# Patient Record
Sex: Male | Born: 1975 | Race: White | Hispanic: Yes | Marital: Single | Smoking: Never smoker
Health system: Southern US, Community
[De-identification: ages and names within clinical notes are randomized; demographics above are authoritative.]

## PROBLEM LIST (undated history)

## (undated) DIAGNOSIS — I10 Essential (primary) hypertension: Secondary | ICD-10-CM

## (undated) DIAGNOSIS — K219 Gastro-esophageal reflux disease without esophagitis: Secondary | ICD-10-CM

## (undated) HISTORY — DX: Gastro-esophageal reflux disease without esophagitis: K21.9

## (undated) HISTORY — PX: APPENDECTOMY: SHX54

## (undated) HISTORY — PX: CHOLECYSTECTOMY: SHX55

---

## 2003-12-21 ENCOUNTER — Inpatient Hospital Stay (HOSPITAL_COMMUNITY): Admission: EM | Admit: 2003-12-21 | Discharge: 2003-12-23 | Payer: Self-pay | Admitting: Emergency Medicine

## 2003-12-21 ENCOUNTER — Encounter (INDEPENDENT_AMBULATORY_CARE_PROVIDER_SITE_OTHER): Payer: Self-pay | Admitting: *Deleted

## 2004-01-12 ENCOUNTER — Ambulatory Visit (HOSPITAL_COMMUNITY): Admission: RE | Admit: 2004-01-12 | Discharge: 2004-01-12 | Payer: Self-pay | Admitting: Surgery

## 2005-01-11 IMAGING — CT CT ABDOMEN WO/W CM
1 of 2 series · 14 of 32 positions shown, 20 images · IV contrast ([ID] OMNI 300)
Comparison: none

CLINICAL DATA: Cholecystectomy three weeks ago.  Right-sided pain, hematuria.  Rule out kidney stone.  History of appendectomy. 
 CT ABDOMEN WITHOUT AND WITH CONTRAST 01/12/04
 Contrast:  120 cc Omnipaque 300 IV. 
 Initial unenhanced scans were obtained to evaluate for renal calculi.  There is a 4 mm stone in the left mid kidney.  However, the patient has symptoms on the right at this time.  No kidney stones are seen on the right.  There is no hydronephrosis.  The gallbladder has been removed.  After contrast infusion, the liver, spleen, and pancreas are normal.  The kidneys enhance normally and without any obstruction.  The bowel appears normal. 
 IMPRESSION
 4 mm left renal calculus without obstruction.
 CT PELVIS WITHOUT AND WITH CONTRAST
 No ureteral calculi are identified.  There is no free fluid, and the bowel appears normal.  There is no adenopathy. 
 Negative.

[Series 3: routine abdomen · axial · 0.70mm/px · z∈[-420,-25]mm · 14 of 117 slices shown, 20 images]
[im 7/117  soft-tissue]
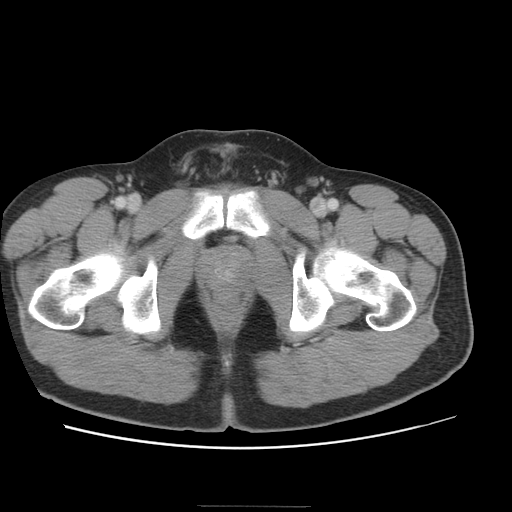
[im 7/117  bone]
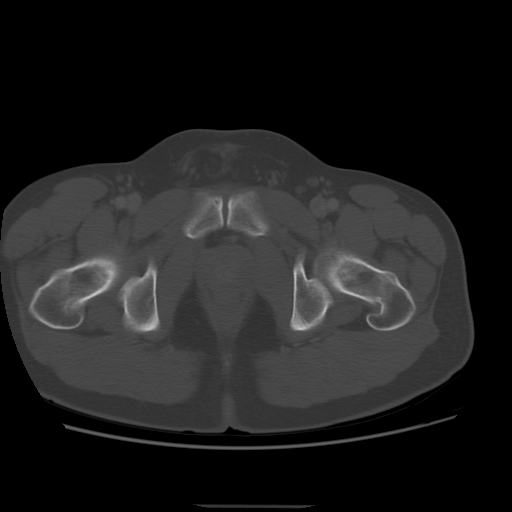
[im 14/117  soft-tissue]
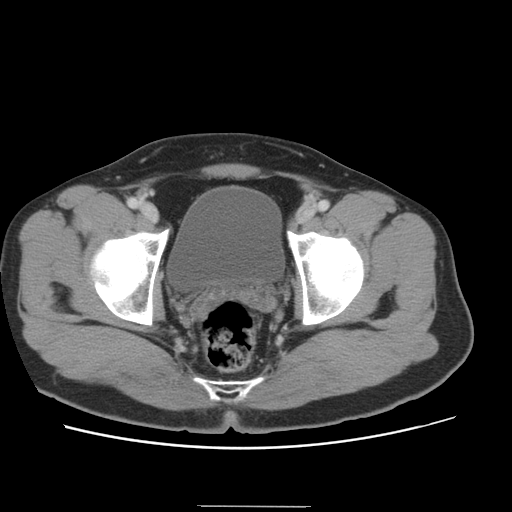
[im 21/117  soft-tissue]
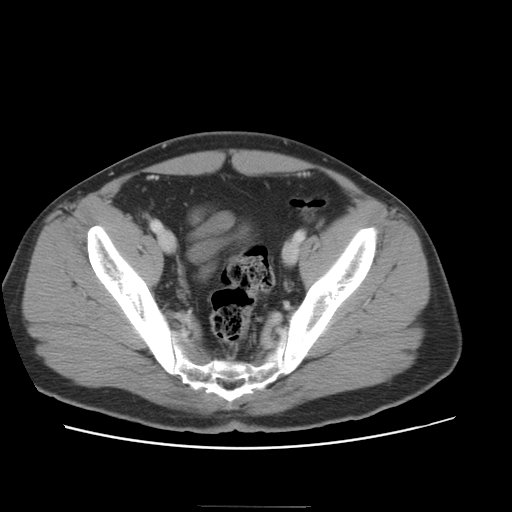
[im 35/117  soft-tissue]
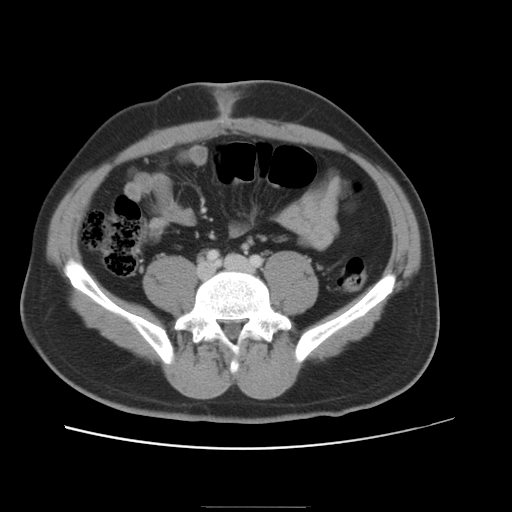
[im 41/117  soft-tissue]
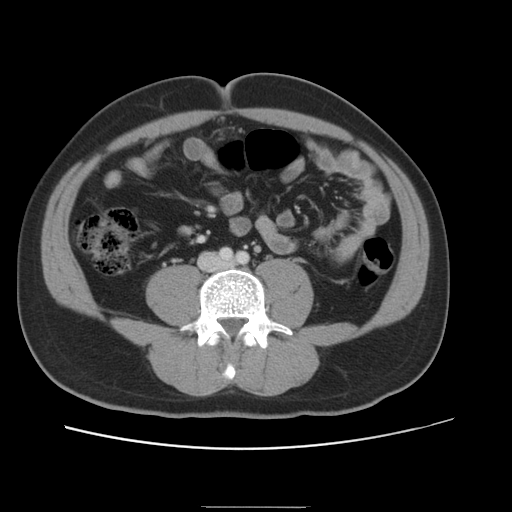
[im 48/117  soft-tissue]
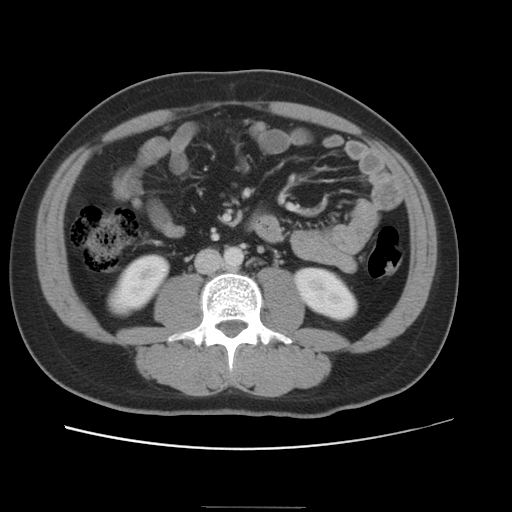
[im 55/117  soft-tissue]
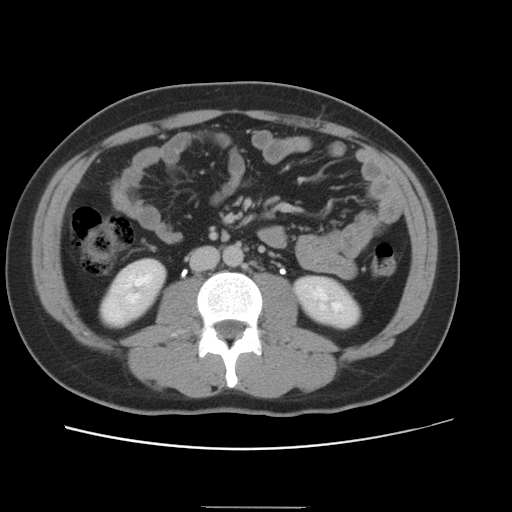
[im 62/117  soft-tissue]
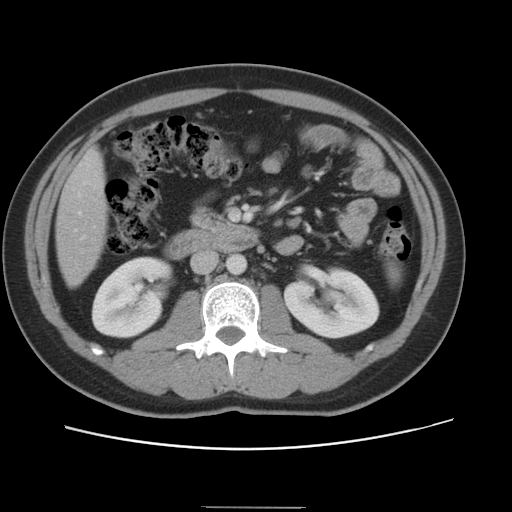
[im 69/117  soft-tissue]
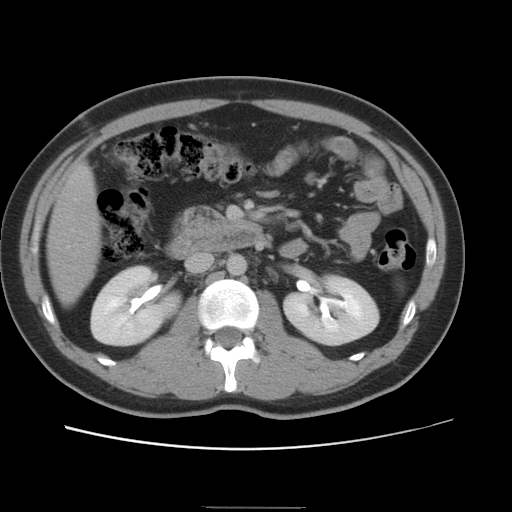
[im 69/117  bone]
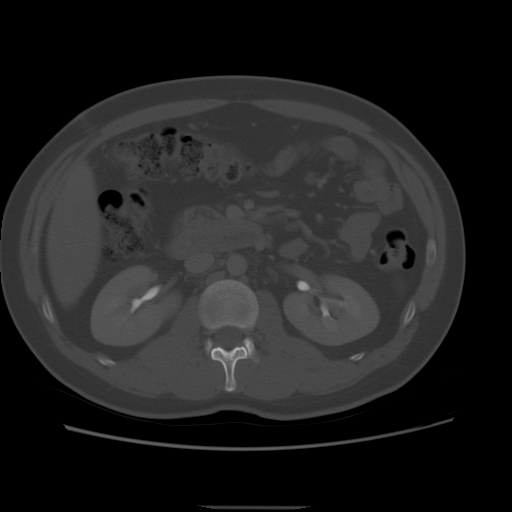
[im 76/117  soft-tissue]
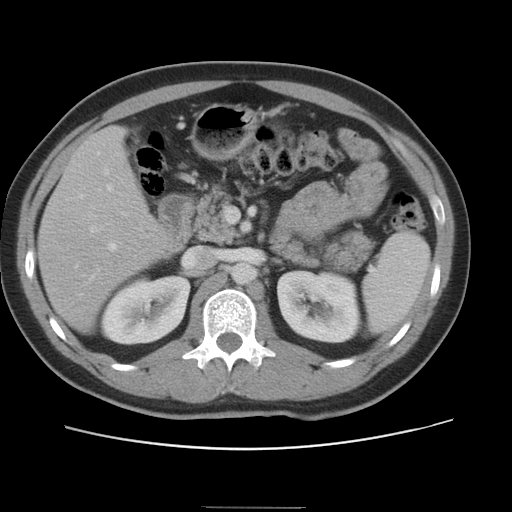
[im 89/117  soft-tissue]
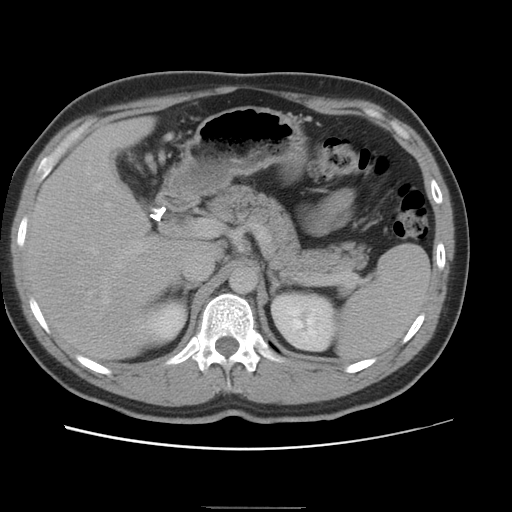
[im 89/117  lung]
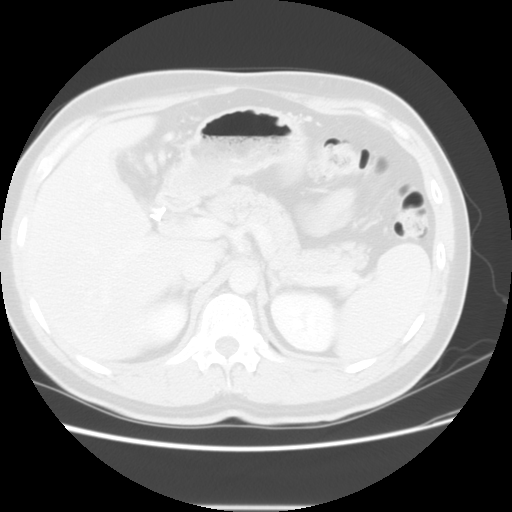
[im 96/117  soft-tissue]
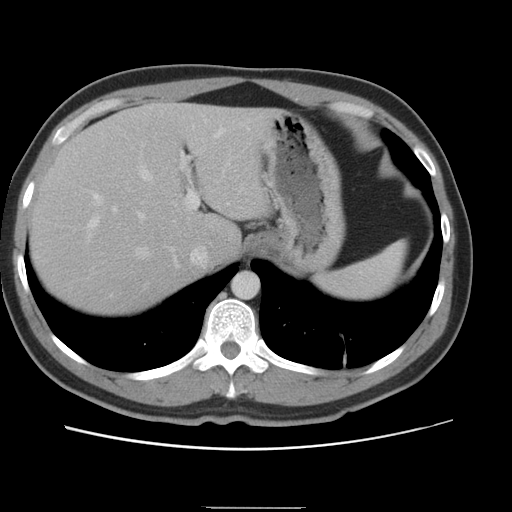
[im 96/117  lung]
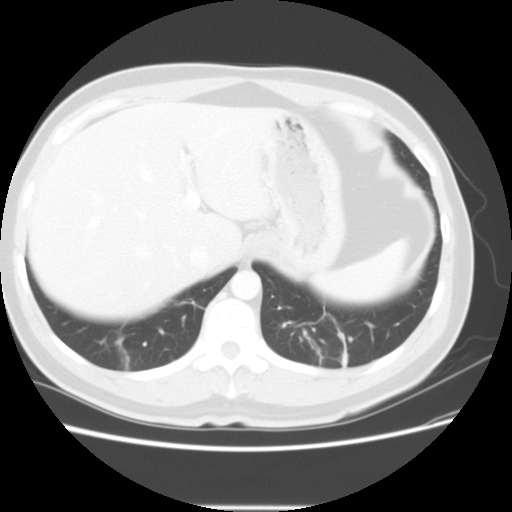
[im 103/117  soft-tissue]
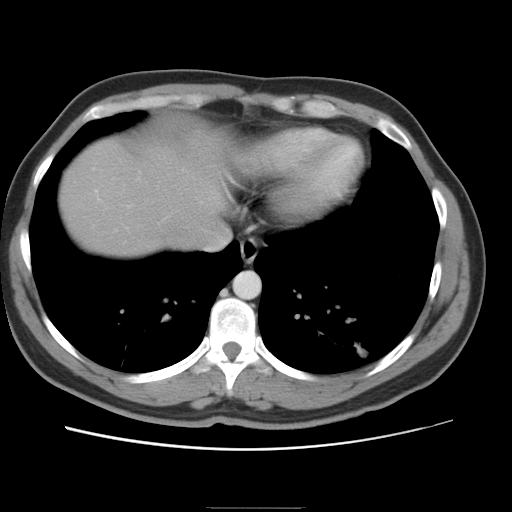
[im 103/117  lung]
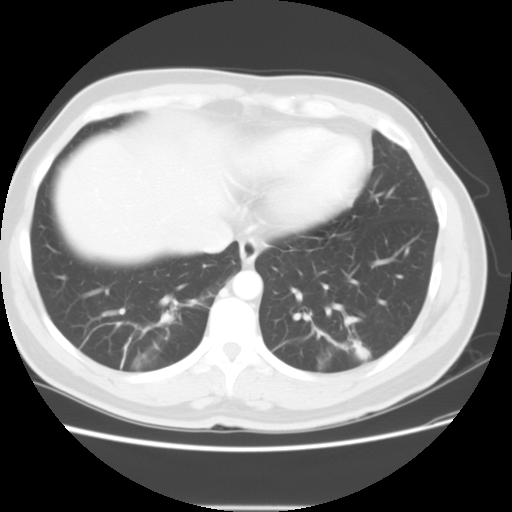
[im 110/117  soft-tissue]
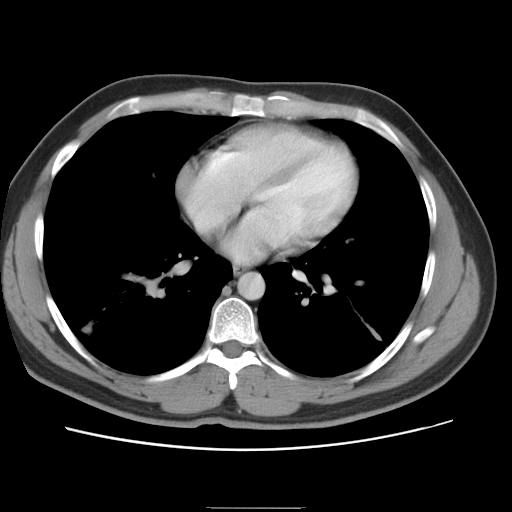
[im 110/117  lung]
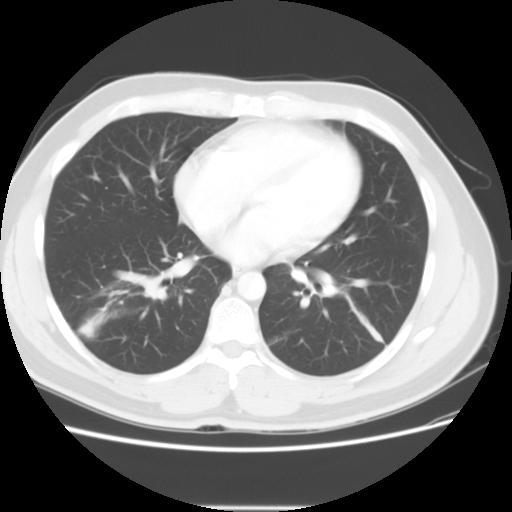

[14 of 32 positions shown; findings below may reference images not displayed]

## 2007-07-29 ENCOUNTER — Emergency Department (HOSPITAL_COMMUNITY): Admission: EM | Admit: 2007-07-29 | Discharge: 2007-07-30 | Payer: Self-pay | Admitting: Emergency Medicine

## 2007-08-18 ENCOUNTER — Emergency Department (HOSPITAL_COMMUNITY): Admission: EM | Admit: 2007-08-18 | Discharge: 2007-08-18 | Payer: Self-pay | Admitting: Emergency Medicine

## 2011-01-09 NOTE — Discharge Summary (Signed)
NAME:  Zachary Monroe, Zachary Monroe NO.:  [id number]   MEDICAL RECORD NO.:  [id number]                   PATIENT TYPE:  INP   LOCATION:  0442                                 FACILITY:  [facility]   PHYSICIAN:  Velora Heckler, M.D.                DATE OF BIRTH:  September 05, 1975   DATE OF ADMISSION:  12/21/2003  DATE OF DISCHARGE:  12/23/2003                                 DISCHARGE SUMMARY   REASON FOR ADMISSION:  Abdominal pain, nausea.   HISTORY:  The patient is a 35 year old [place] male who presented to Dr.  Cleta Alberts at [facility] with right upper quadrant abdominal pain,  nausea, and vomiting.  Abdominal x-ray showed calcified densities in the  right upper quadrant.  White count was elevated at 12,000.  He was referred  to [facility] for surgical consultation.  The patient was seen and  evaluated in the emergency room.  He was felt to have unrelenting biliary  colic and acute cholecystitis.  He was admitted to the surgical service.   HOSPITAL COURSE:  The patient was admitted on December 21, 2003, from the  emergency department, immediately taken to the operating room where he  underwent laparoscopic cholecystectomy.  Findings at surgery included acute  cholecystitis and very large gallstones with impaction in the gallbladder  neck.  Postoperatively, the patient did well.  He progressed from liquids to  a solid diet.  He had a brief episode of urinary retention which resolved  with in-and-out catheterization.  The patient was prepared for discharge  home on the second postoperative day.   DISCHARGE PLANNING:  The patient is discharged home today, Dec 23, 2003, in  good condition, tolerating a regular diet, and ambulating independently.  He  will be seen back at my office at [facility] in two weeks.   DISCHARGE MEDICATIONS:  Vicodin as needed for pain.   FINAL DIAGNOSES:  1. Acute cholecystitis.  2. Cholelithiasis.   CONDITION ON DISCHARGE:   Improved.                                               Velora Heckler, M.D.    TMG/MEDQ  D:  12/23/2003  T:  12/23/2003  Job:  [id number]   cc:   Brett Canales A. Cleta Alberts, M.D.  [address]  [place]  [place] [address]  Fax: [phone]

## 2011-01-09 NOTE — H&P (Signed)
NAME:  Zachary Monroe, Zachary Monroe NO.:  [id number]   MEDICAL RECORD NO.:  [id number]                   PATIENT TYPE:  EMS   LOCATION:  ED                                   FACILITY:  [facility]   PHYSICIAN:  Velora Heckler, M.D.                DATE OF BIRTH:  24-Oct-1975   DATE OF ADMISSION:  12/21/2003  DATE OF DISCHARGE:                                HISTORY & PHYSICAL   CHIEF COMPLAINT:  Abdominal pain, nausea.   HISTORY OF PRESENT ILLNESS:  The patient is a 35 year old [place] male who  presents to Dr. Cleta Alberts with a 12-hour history of abdominal pain in the right  upper quadrant epigastrium associated with nausea and vomiting. The patient  had sudden onset early this morning after drinking milk. The pain persisted  and became more severe. It was associated with onset of nausea followed by  emesis on multiple occasions.  The patient was seen by Daub and noted to  have an elevated white count of 12,000. Abdominal x-rays demonstrated  calcified densities in the right upper quadrant. Gallstones were suspected.  The patient was referred to general surgery for unrelenting biliary colic.  The patient presents to the emergency room at [facility] for  evaluation. Abdominal ultrasound was obtained which documented gallstones.  There was moderate gallbladder wall thickening. There was no common bile  duct dilatation. Laboratory studies showed normal liver function tests and  normal pancreatic enzymes. The patient has persistent pain and is now  admitted for treatment of biliary colic which is unrelenting.   PAST MEDICAL HISTORY:  Status post appendectomy 13 years ago in [place].   MEDICATIONS:  None.   ALLERGIES:  None known.   SOCIAL HISTORY:  The patient is married. He lives in [place]. He is  accompanied by his wife. He has one child. He denies alcohol use. He denies  tobacco use. He denies illicit drug use. He is employed in a warehouse.   FAMILY HISTORY:   Notable for diabetes in the patient's father.   REVIEW OF SYSTEMS:  A 15-system review without significant other positives  except as noted above.   PHYSICAL EXAMINATION:  GENERAL: A 35 year old, well-developed, well-  nourished [place] male in mild to moderate discomfort, on a stretcher in the  emergency department.  VITAL SIGNS: Temperature 98.4, pulse 85, respirations 20, blood pressure  129/85.  HEENT: Normocephalic and atraumatic. Sclerae clear. Conjunctiva clear.  Pupils are equal and reactive. Dentition is good. Mucous membranes are  moist.  NECK: Supple without mass. Thyroid normal without nodularity. There is no  anterior or posterior cervical lymphadenopathy. There are no supraclavicular  masses.  LUNGS: Clear to auscultation bilaterally without rales, rhonchi, or wheezes.  CARDIAC: Regular rate and rhythm without murmur. Peripheral pulses are full.  ABDOMEN: Soft. There are bowel sounds on auscultation. There is a well  healed surgical wound in the right paramedian  location in the right lower  quadrant. There is tenderness to palpation and percussion particularly in  the right upper quadrant. There is voluntary guarding. There is a Murphy's  sign present with deep inspiration. There is no sign of hernia.  EXTREMITIES: Nontender without edema.  NEUROLOGIC: The patient is alert and oriented without focal deficit.   LABORATORY DATA:  From [facility] reveal white blood cell count  12.6, hemoglobin normal at 15.2; from [facility] a comprehensive  metabolic panel shows normal electrolytes with the exception of 132 and  potassium of 3.4. Liver function tests were all within normal limits.  Amylase 54 and lipase 22. Urinalysis is benign.   RADIOGRAPHIC STUDIES:  Ultrasound of the abdomen and outside abdominal x-  rays are reviewed with the radiologist on duty. This shows calcified  gallstones measuring 2.9 and 2.6 cm respectively with a 4 mm thickened   gallbladder wall.   IMPRESSION:  Subacute cholecystitis, cholelithiasis, unrelenting biliary  colic.   PLAN:  1. Admission to [facility].  2. To operating room for laparoscopic cholecystectomy.  3. Routine postoperative care.   I have discussed with the patient and his wife the indications for surgery.  I have discussed laparoscopic technique versus open technique. I have quoted  them an approximately 90% chance of success by laparoscopy and an  approximately 10% chance of conversion to open surgery. We discussed the  hospital stay to be expected and his recovery and return to work. They  understand and wish to proceed. We will notify the operating room.                                               Velora Heckler, M.D.    TMG/MEDQ  D:  12/21/2003  T:  12/21/2003  Job:  [id number]   cc:   Brett Canales A. Cleta Alberts, M.D.  [address]  [place]  [place] [address]  Fax: [phone]

## 2011-01-09 NOTE — Op Note (Signed)
NAME:  Zachary, Monroe NO.:  [id number]   MEDICAL RECORD NO.:  [id number]                   PATIENT TYPE:  INP   LOCATION:  0101                                 FACILITY:  [facility]   PHYSICIAN:  Velora Heckler, M.D.                DATE OF BIRTH:  [birth year]   DATE OF PROCEDURE:  12/21/2003  DATE OF DISCHARGE:                                 OPERATIVE REPORT   PREOPERATIVE DIAGNOSES:  Acute cholecystitis, cholelithiasis, unrelenting  biliary colic.   POSTOPERATIVE DIAGNOSES:  Acute cholecystitis, cholelithiasis, unrelenting  biliary colic.   PROCEDURE:  Laparoscopic cholecystectomy.   SURGEON:  Velora Heckler, M.D.   ASSISTANT:  Leonie Man, M.D.   ANESTHESIA:  General.   ESTIMATED BLOOD LOSS:  Minimal.   PREPARATION:  Betadine.   COMPLICATIONS:  None.   INDICATIONS:  The patient is a 35 year old Zachary Monroe male, presents to the  emergency department with abdominal pain.  He had been seen by Dr. Earl Lites at [facility].  The patient has suspected gallstones.  Abdominal x-rays showed calcified densities in the right upper quadrant.  White count was elevated at 12.9.  The patient was evaluated in [facility]  Emergency Department.  He was noted to have normal liver function tests and  normal lipase and amylase levels.  Ultrasound of the abdomen was performed  which showed large gallstones with a very large stone impacted in the neck  of the gallbladder.  The gallbladder wall was thickened by ultrasound  criteria.  The patient is now brought to the operating room for acute  cholecystitis and cholelithiasis.   DESCRIPTION OF PROCEDURE:  The procedure is done in OR #1 at the [facility].  The patient is brought to the operating room, placed in  a supine position on the operating room table.  Following the administration  of general anesthesia, the patient is prepped and draped in the usual strict  aseptic fashion.   After ascertaining that an adequate level of anesthesia  had been obtained, an infraumbilical incision is made with a #15 blade.  The  fascia is incised in the midline, and the peritoneal cavity is entered  cautiously.  A 0 Vicryl pursestring suture is placed in the fascia.  An  Hasson cannula is introduced and secured with the pursestring suture.  Abdomen is insufflated with carbon dioxide.  Laparoscope is introduced, and  the abdomen is explored.  There are adhesions to his previous right lower  quadrant incision.  Liver appears normal.  Gallbladder is tense, distended,  inflamed, and edematous.  Operative ports are placed in the midline,  midclavicular line, and anterior axillary line along the right costal  margin.  Aspirating trocar is introduced into the fundus of the gallbladder,  and the gallbladder is aspirated.  It contains crystal-clear fluid,  consistent with hydrops of the gallbladder.  Gallbladder is then  grasped and  retracted cephalad, and dissection is begun at the neck of the gallbladder.  This was quite difficult due to a very large stone impacted in the neck of  the gallbladder.  However, the cystic artery is dissected out, doubly  clipped, divided.  Cystic duct is dissected out and doubly clipped and  divided.  The gallbladder is then excised from the gallbladder bed using the  hook electrocautery to achieve hemostasis.  Gallbladder is completely  excised and placed into an EndoCatch bag.  With some difficulty, it is  extracted through the umbilical port.  The umbilical wound has to be opened  slightly at the level of the fascia cephalad and caudad in order to  accommodate the large stones.  After removal of the gallbladder and  EndoCatch bag through the umbilical site, the right upper quadrant is  irrigated.  Good hemostasis is noted in the gallbladder bed.  The umbilical  fascial wound is then closed with interrupted 0 Vicryl sutures.  Ports are  then removed under  direct vision, and pneumoperitoneum is released.  Good  hemostasis is noted at all port sites.  Port sites are anesthetized with  local anesthetic.  All wounds are closed with interrupted 4-0 Vicryl  subcuticular sutures.  The wounds are washed and dried, and Benzoin and  Steri-Strips are applied.  Sterile dressings are applied.  The patient is  awakened from anesthesia and brought to the recovery room in stable  condition.  The patient tolerated the procedure well.                                               Velora Heckler, M.D.    TMG/MEDQ  D:  12/21/2003  T:  12/22/2003  Job:  [id number]   cc:   Brett Canales A. Cleta Alberts, M.D.  [address]  [place]  [place] [address]  Fax: [phone]

## 2011-06-01 LAB — DIFFERENTIAL
Basophils Absolute: 0
Lymphocytes Relative: 31
Monocytes Absolute: 0.8

## 2011-06-01 LAB — URINALYSIS, ROUTINE W REFLEX MICROSCOPIC
Glucose, UA: NEGATIVE
Ketones, ur: NEGATIVE
Protein, ur: NEGATIVE
Specific Gravity, Urine: 1.008

## 2011-06-01 LAB — CBC
Hemoglobin: 15.1
MCV: 83.5
RBC: 5.14
RDW: 12.7

## 2011-06-01 LAB — BASIC METABOLIC PANEL
CO2: 27
Calcium: 9.5
Creatinine, Ser: 0.89
GFR calc non Af Amer: >60
Sodium: 138

## 2014-09-11 ENCOUNTER — Ambulatory Visit (INDEPENDENT_AMBULATORY_CARE_PROVIDER_SITE_OTHER): Payer: Self-pay | Admitting: Physician Assistant

## 2014-09-11 VITALS — BP 100/70 | HR 85 | Temp 98.3°F | Resp 16 | Ht 65.5 in | Wt 160.5 lb

## 2014-09-11 DIAGNOSIS — K2970 Gastritis, unspecified, without bleeding: Principal | ICD-10-CM

## 2014-09-11 DIAGNOSIS — K208 Other esophagitis: Secondary | ICD-10-CM

## 2014-09-11 DIAGNOSIS — K209 Esophagitis, unspecified: Principal | ICD-10-CM

## 2014-09-11 MED ORDER — RANITIDINE HCL 150 MG PO TABS: 150.0000 mg | ORAL_TABLET | Freq: Two times a day (BID) | ORAL | Status: AC

## 2014-09-11 MED ORDER — OMEPRAZOLE 40 MG PO CPDR: 40.0000 mg | DELAYED_RELEASE_CAPSULE | Freq: Every day | ORAL | Status: AC

## 2014-09-11 NOTE — Patient Instructions (Signed)
1. Call back in 2 weeks if symptoms are not better and will order lab work at that time.

## 2014-09-12 ENCOUNTER — Encounter: Payer: Self-pay | Admitting: Physician Assistant

## 2014-09-12 NOTE — Progress Notes (Signed)
   Subjective:    Patient ID: Zachary Monroe, male    DOB: 1975-11-25, 39 y.o.   MRN: [id number]  HPI Patient presents for RUQ pain that has been present for 1 month and was intermittent following some meals, but has been daily after meals for the past week. Pain has felt sharp and like pressure and last for 20-30 minutes and goes away on its own. Occurred more with spicy food and if he drank coffee, but now happens with other foods. Most meals that wife cooks are spicy as this is his preference. Endorses some flatulence, nausea, and more loose, lighter stools. Denies fever, change in appetite, weight loss, vomiting, constipation, or urinary sx. Has not had any recent travel, eating raw or undercooked food, or eaten any new types of foods. Had cholecystectomy in 2005 and appendectomy when he was 15. No other surgeries reported. Has had GERD sx in the past, but not like this.    Review of Systems  Constitutional: Negative for fever and appetite change.  Respiratory: Negative for cough, shortness of breath and wheezing.   Cardiovascular: Negative for chest pain.  Gastrointestinal: Positive for nausea, abdominal pain and diarrhea. Negative for vomiting, constipation, blood in stool and abdominal distention.  Genitourinary: Negative.   Musculoskeletal: Negative for back pain.  Neurological: Positive for light-headedness. Negative for dizziness and headaches.       Objective:   Physical Exam  Constitutional: He is oriented to person, place, and time. He appears well-developed and well-nourished. No distress.  Blood pressure 100/70, pulse 85, temperature 98.3 F (36.8 C), temperature source Oral, resp. rate 16, height 5' 5.5" (1.664 m), weight 160 lb 8 oz (72.802 kg), SpO2 100 %.  HENT:  Head: Normocephalic and atraumatic.  Right Ear: External ear normal.  Left Ear: External ear normal.  Cardiovascular: Normal rate, regular rhythm and normal heart sounds.  Exam reveals no gallop and no friction  rub.   No murmur heard. Pulmonary/Chest: Effort normal and breath sounds normal. He has no decreased breath sounds. He has no wheezes. He has no rhonchi. He has no rales.  Abdominal: Soft. Normal appearance, normal aorta and bowel sounds are normal. He exhibits no distension and no mass. There is tenderness in the right upper quadrant. There is no rigidity, no rebound, no guarding, no CVA tenderness, no tenderness at McBurney's point and negative Murphy's sign. No hernia.  Neurological: He is alert and oriented to person, place, and time.  Skin: Skin is warm, dry and intact. No rash noted. He is not diaphoretic. No erythema. No pallor.       Assessment & Plan:  1. Gastroesophagitis If not improved in 2 weeks, will do further evaluation with labs and urine (patient request labs-only visit as he does not have insurance). Food guidelines given as well. - ranitidine (ZANTAC) 150 MG tablet; Take 1 tablet (150 mg total) by mouth 2 (two) times daily.  Dispense: 60 tablet; Refill: 2 - omeprazole (PRILOSEC) 40 MG capsule; Take 1 capsule (40 mg total) by mouth daily.  Dispense: 30 capsule; Refill: 3   Morocco Gipe PA-C  [facility] 09/12/2014 12:54 PM

## 2014-09-13 NOTE — Progress Notes (Signed)
Reviewed and agree with plan.

## 2022-03-01 ENCOUNTER — Encounter (HOSPITAL_BASED_OUTPATIENT_CLINIC_OR_DEPARTMENT_OTHER): Payer: Self-pay | Admitting: Emergency Medicine

## 2022-03-01 ENCOUNTER — Emergency Department (HOSPITAL_BASED_OUTPATIENT_CLINIC_OR_DEPARTMENT_OTHER)
Admission: EM | Admit: 2022-03-01 | Discharge: 2022-03-01 | Disposition: A | Discharge status: Home / Self Care | Payer: Self-pay | Attending: Emergency Medicine | Admitting: Emergency Medicine

## 2022-03-01 ENCOUNTER — Other Ambulatory Visit: Payer: Self-pay

## 2022-03-01 ENCOUNTER — Emergency Department (HOSPITAL_BASED_OUTPATIENT_CLINIC_OR_DEPARTMENT_OTHER): Payer: Self-pay

## 2022-03-01 DIAGNOSIS — R000 Tachycardia, unspecified: Secondary | ICD-10-CM | POA: Insufficient documentation

## 2022-03-01 DIAGNOSIS — F12920 Cannabis use, unspecified with intoxication, uncomplicated: Secondary | ICD-10-CM

## 2022-03-01 DIAGNOSIS — F12120 Cannabis abuse with intoxication, uncomplicated: Secondary | ICD-10-CM | POA: Insufficient documentation

## 2022-03-01 DIAGNOSIS — R0602 Shortness of breath: Secondary | ICD-10-CM | POA: Insufficient documentation

## 2022-03-01 LAB — CBC
HCT: 39.9 % (ref 39.0–52.0)
Hemoglobin: 14.1 g/dL (ref 13.0–17.0)
MCH: 29.7 pg (ref 26.0–34.0)
MCHC: 35.3 g/dL (ref 30.0–36.0)
MCV: 84.2 fL (ref 80.0–100.0)
Platelets: 246 10*3/uL (ref 150–400)
RBC: 4.74 MIL/uL (ref 4.22–5.81)
RDW: 12.4 % (ref 11.5–15.5)
WBC: 10.6 10*3/uL — ABNORMAL HIGH (ref 4.0–10.5)
nRBC: 0 % (ref 0.0–0.2)

## 2022-03-01 LAB — BASIC METABOLIC PANEL WITH GFR
Anion gap: 7 (ref 5–15)
BUN: 17 mg/dL (ref 6–20)
CO2: 22 mmol/L (ref 22–32)
Calcium: 8.9 mg/dL (ref 8.9–10.3)
Chloride: 106 mmol/L (ref 98–111)
Creatinine, Ser: 1.07 mg/dL (ref 0.61–1.24)
GFR, Estimated: >60 mL/min
Glucose, Bld: 174 mg/dL — ABNORMAL HIGH (ref 70–99)
Potassium: 3.5 mmol/L (ref 3.5–5.1)
Sodium: 135 mmol/L (ref 135–145)

## 2022-03-01 MED ORDER — SODIUM CHLORIDE 0.9 % IV BOLUS
1000.0000 mL | Freq: Once | INTRAVENOUS | Status: AC
  Administered 2022-03-01: 1000 mL via INTRAVENOUS

## 2022-03-01 NOTE — ED Notes (Signed)
D/c paperwork reviewed with pt and s/o at bedside. No questions or concerns at time of d/c. Ambulatory to ED exit with steady gait.

## 2022-03-01 NOTE — ED Provider Notes (Signed)
[facility] EMERGENCY DEPARTMENT Provider Note   CSN: [id number] Arrival date & time: 03/01/22  1608     History  Chief Complaint  Patient presents with   Shortness of Breath    Zachary Monroe is a 46 y.o. male who presents emergency department complaining of shortness of breath, shakiness, and chills after eating a THC edible several hours ago.  Patient states that he is never consumed THC before.  He was feeling slightly tired this morning, as he did not sleep well last night.  His friend had made some cookies, and he ate half of 1.  About an hour later he started feeling very hungry, then started experiencing the symptoms listed above.  Denies any chest pain, headache or syncope.   Shortness of Breath Associated symptoms: no abdominal pain, no chest pain and no vomiting        Home Medications Prior to Admission medications   Medication Sig Start Date End Date Taking? Authorizing Provider  omeprazole (PRILOSEC) 40 MG capsule Take 1 capsule (40 mg total) by mouth daily. 09/11/14   Brewington, Tishira R, PA-C  ranitidine (ZANTAC) 150 MG tablet Take 1 tablet (150 mg total) by mouth 2 (two) times daily. 09/11/14   Brewington, Tishira R, PA-C      Allergies    Patient has no known allergies.    Review of Systems   Review of Systems  Constitutional:  Positive for chills.  Respiratory:  Positive for shortness of breath.   Cardiovascular:  Negative for chest pain and leg swelling.  Gastrointestinal:  Negative for abdominal pain, nausea and vomiting.  Neurological:  Positive for tremors.  All other systems reviewed and are negative.   Physical Exam Updated Vital Signs BP 123/74   Pulse 83   Temp 98 F (36.7 C) (Oral)   Resp (!) 23   SpO2 96%  Physical Exam Vitals and nursing note reviewed.  Constitutional:      General: He is awake.     Appearance: Normal appearance.     Comments: Appears intoxicated  HENT:     Head: Normocephalic and atraumatic.  Eyes:      Conjunctiva/sclera:     Right eye: Right conjunctiva is injected.     Left eye: Left conjunctiva is injected.  Cardiovascular:     Rate and Rhythm: Normal rate and regular rhythm.  Pulmonary:     Effort: Pulmonary effort is normal. No respiratory distress.     Breath sounds: Normal breath sounds.  Abdominal:     General: There is no distension.     Palpations: Abdomen is soft.     Tenderness: There is no abdominal tenderness.  Skin:    General: Skin is warm and dry.  Neurological:     General: No focal deficit present.  Psychiatric:        Behavior: Behavior is cooperative.     ED Results / Procedures / Treatments   Labs (all labs ordered are listed, but only abnormal results are displayed) Labs Reviewed  CBC - Abnormal; Notable for the following components:      Result Value   WBC 10.6 (*)    All other components within normal limits  BASIC METABOLIC PANEL - Abnormal; Notable for the following components:   Glucose, Bld 174 (*)    All other components within normal limits    EKG None  Radiology DG Chest 2 View  Result Date: 03/01/2022 CLINICAL DATA:  Shortness of breath. EXAM: CHEST - 2  VIEW COMPARISON:  None Available. FINDINGS: The heart size and mediastinal contours are within normal limits. Normal pulmonary vascularity. Minimal linear scarring in the lingula. No focal consolidation, pleural effusion, or pneumothorax. No acute osseous abnormality. IMPRESSION: No active cardiopulmonary disease. Electronically Signed   By: Obie Dredge M.D.   On: 03/01/2022 16:33    Procedures Procedures    Medications Ordered in ED Medications  sodium chloride 0.9 % bolus 1,000 mL (1,000 mLs Intravenous New Bag/Given 03/01/22 1706)    ED Course/ Medical Decision Making/ A&P                           Medical Decision Making Amount and/or Complexity of Data Reviewed Labs: ordered. Radiology: ordered.  Patient is a 46 year old male with no significant past medical  history who presents the emergency department complaining of shortness of breath and tremors after consuming THC edible several hours ago.  On exam patient appears intoxicated with bilateral conjunctival injection.  No respiratory distress, and reassuring vital signs.  CBC and BMP without gross abnormality.  EKG with normal sinus rhythm and rate of 93.  Chest x-ray with no acute cardiopulmonary abnormalities.  Given IV fluids for tachycardia  Upon reevaluation, patient states that his symptoms have improved.   After consideration of the diagnostic results and the patients response to treatment, I feel that emergency department workup does not suggest an emergent condition requiring admission or immediate intervention beyond what has been performed at this time. The plan is: discharge to home. Suspect symptoms likely related to [facility] intoxication. The patient is safe for discharge and has been instructed to return immediately for worsening symptoms, change in symptoms or any other concerns.  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 Every effort was made to ensure accuracy; however, inadvertent computerized transcription errors may be present.    Jeanella Flattery 03/01/22 1759    Milagros Loll, MD 03/01/22 [phone]

## 2022-03-01 NOTE — ED Notes (Signed)
Pt ambulatory to bathroom, no assistance needed.  

## 2022-03-01 NOTE — ED Triage Notes (Signed)
Pt reports shortness of breath, shakiness and chills after eating a THC gummy for the first time.

## 2022-03-01 NOTE — Discharge Instructions (Signed)
You were seen in the emergency department for shortness of breath.  As we discussed, your lab work, chest x-ray, and EKG all looked reassuring today. I think your symptoms are related to consuming THC today.  Continue to monitor how you're doing and return to the ER for new or worsening symptoms.

## 2023-10-12 ENCOUNTER — Emergency Department (HOSPITAL_BASED_OUTPATIENT_CLINIC_OR_DEPARTMENT_OTHER)
Admission: EM | Admit: 2023-10-12 | Discharge: 2023-10-13 | Disposition: A | Discharge status: Home / Self Care | Payer: Self-pay | Attending: Emergency Medicine | Admitting: Emergency Medicine

## 2023-10-12 ENCOUNTER — Encounter (HOSPITAL_BASED_OUTPATIENT_CLINIC_OR_DEPARTMENT_OTHER): Payer: Self-pay | Admitting: Emergency Medicine

## 2023-10-12 ENCOUNTER — Other Ambulatory Visit: Payer: Self-pay

## 2023-10-12 ENCOUNTER — Emergency Department (HOSPITAL_BASED_OUTPATIENT_CLINIC_OR_DEPARTMENT_OTHER): Payer: Self-pay

## 2023-10-12 DIAGNOSIS — R030 Elevated blood-pressure reading, without diagnosis of hypertension: Secondary | ICD-10-CM

## 2023-10-12 DIAGNOSIS — I10 Essential (primary) hypertension: Secondary | ICD-10-CM | POA: Insufficient documentation

## 2023-10-12 LAB — URINALYSIS, MICROSCOPIC (REFLEX)
Bacteria, UA: NONE SEEN
Squamous Epithelial / HPF: NONE SEEN /[HPF] (ref 0–5)
WBC, UA: NONE SEEN WBC/hpf (ref 0–5)

## 2023-10-12 LAB — URINALYSIS, ROUTINE W REFLEX MICROSCOPIC
Bilirubin Urine: NEGATIVE
Glucose, UA: NEGATIVE mg/dL
Ketones, ur: NEGATIVE mg/dL
Leukocytes,Ua: NEGATIVE
Nitrite: NEGATIVE
Protein, ur: NEGATIVE mg/dL
Specific Gravity, Urine: 1.01 (ref 1.005–1.030)
pH: 7 (ref 5.0–8.0)

## 2023-10-12 LAB — TROPONIN I (HIGH SENSITIVITY): Troponin I (High Sensitivity): 4 ng/L (ref <18)

## 2023-10-12 LAB — CBC
HCT: 41.2 % (ref 39.0–52.0)
Hemoglobin: 14.4 g/dL (ref 13.0–17.0)
MCH: 29.1 pg (ref 26.0–34.0)
MCHC: 35 g/dL (ref 30.0–36.0)
MCV: 83.2 fL (ref 80.0–100.0)
Platelets: 263 10*3/uL (ref 150–400)
RBC: 4.95 MIL/uL (ref 4.22–5.81)
RDW: 12.4 % (ref 11.5–15.5)
WBC: 8 10*3/uL (ref 4.0–10.5)
nRBC: 0 % (ref 0.0–0.2)

## 2023-10-12 LAB — BASIC METABOLIC PANEL
Anion gap: 10 (ref 5–15)
BUN: 12 mg/dL (ref 6–20)
CO2: 23 mmol/L (ref 22–32)
Calcium: 9.1 mg/dL (ref 8.9–10.3)
Chloride: 103 mmol/L (ref 98–111)
Creatinine, Ser: 1 mg/dL (ref 0.61–1.24)
GFR, Estimated: >60 mL/min (ref >60)
Glucose, Bld: 119 mg/dL — ABNORMAL HIGH (ref 70–99)
Potassium: 3.2 mmol/L — ABNORMAL LOW (ref 3.5–5.1)
Sodium: 136 mmol/L (ref 135–145)

## 2023-10-12 MED ORDER — IBUPROFEN 400 MG PO TABS
400.0000 mg | ORAL_TABLET | Freq: Once | ORAL | Status: AC
  Administered 2023-10-12: 400 mg via ORAL
  Filled 2023-10-12: qty 1

## 2023-10-12 MED ORDER — ACETAMINOPHEN 500 MG PO TABS
1000.0000 mg | ORAL_TABLET | Freq: Once | ORAL | Status: AC
  Administered 2023-10-12: 1000 mg via ORAL
  Filled 2023-10-12: qty 2

## 2023-10-12 NOTE — ED Triage Notes (Addendum)
 Patient c/o head pressure, dizziness, left sided chest pain, and jitters onset this morning. Gradually worsening throughout the day. States "I feel like my head is floating" Reports he took his BP at home and it was 199/122. Wife gave him a dose of her personal lisinopril 30 mg pta. BP 145/94 in triage. Reports no h/o hypertension.

## 2023-10-13 LAB — RESP PANEL BY RT-PCR (RSV, FLU A&B, COVID)  RVPGX2
Influenza A by PCR: NEGATIVE
Influenza B by PCR: NEGATIVE
Resp Syncytial Virus by PCR: NEGATIVE
SARS Coronavirus 2 by RT PCR: NEGATIVE

## 2023-10-13 NOTE — ED Provider Notes (Signed)
  EMERGENCY DEPARTMENT AT [facility] [place] Provider Note   CSN: [id number] Arrival date & time: 10/12/23  2010     History  Chief Complaint  Patient presents with   Hypertension    Zachary Monroe is a 48 y.o. male.  The history is provided by the patient and the spouse.  Hypertension This is a new problem. The current episode started 12 to 24 hours ago. The problem occurs constantly. The problem has been rapidly improving. Pertinent negatives include no abdominal pain and no shortness of breath. Nothing aggravates the symptoms. Nothing relieves the symptoms. The treatment provided no relief.  Patient with previous elevated reading at doctors office had headache and took BP at home and was elevated.  Wife gave him a dose of her medication and they came straight here.  BP here within 30 minutes of taking medications is normalized.  Patient still has headache.  No weakness nor numbness.  No changes of vision or speech.  Also, reports pain in chest today.  No DOE, No SOB.  No n/v/d.       Home Medications Prior to Admission medications   Medication Sig Start Date End Date Taking? Authorizing Provider  omeprazole (PRILOSEC) 40 MG capsule Take 1 capsule (40 mg total) by mouth daily. 09/11/14   Brewington, Tishira R, PA-C  ranitidine (ZANTAC) 150 MG tablet Take 1 tablet (150 mg total) by mouth 2 (two) times daily. 09/11/14   Brewington, Tishira R, PA-C      Allergies    Patient has no known allergies.    Review of Systems   Review of Systems  Constitutional:  Negative for fever.  Respiratory:  Negative for shortness of breath.   Gastrointestinal:  Negative for abdominal pain.  Neurological:  Negative for syncope, facial asymmetry, speech difficulty, weakness, light-headedness and numbness.  All other systems reviewed and are negative.   Physical Exam Updated Vital Signs BP 124/84   Pulse 73   Temp 98.8 F (37.1 C) (Oral)   Resp 18   Ht 5\' 2"  (1.575 m)   Wt  75.8 kg   SpO2 97%   BMI 30.54 kg/m  Physical Exam Vitals and nursing note reviewed.  Constitutional:      General: He is not in acute distress.    Appearance: He is well-developed. He is not diaphoretic.  HENT:     Head: Normocephalic and atraumatic.     Nose: Nose normal.     Mouth/Throat:     Mouth: Mucous membranes are moist.  Eyes:     Extraocular Movements: Extraocular movements intact.     Conjunctiva/sclera: Conjunctivae normal.     Pupils: Pupils are equal, round, and reactive to light.  Cardiovascular:     Rate and Rhythm: Normal rate and regular rhythm.     Pulses: Normal pulses.     Heart sounds: Normal heart sounds.  Pulmonary:     Effort: Pulmonary effort is normal.     Breath sounds: Normal breath sounds. No wheezing or rales.  Abdominal:     General: Bowel sounds are normal.     Palpations: Abdomen is soft.     Tenderness: There is no abdominal tenderness. There is no guarding or rebound.  Musculoskeletal:        General: Normal range of motion.     Cervical back: Normal range of motion and neck supple.  Skin:    General: Skin is warm and dry.  Neurological:     General: No  focal deficit present.     Mental Status: He is alert and oriented to person, place, and time.     Cranial Nerves: No cranial nerve deficit.     Deep Tendon Reflexes: Reflexes normal.     ED Results / Procedures / Treatments   Labs (all labs ordered are listed, but only abnormal results are displayed) Results for orders placed or performed during the hospital encounter of 10/12/23  Urinalysis, Routine w reflex microscopic -Urine, Clean Catch   Collection Time: 10/12/23  8:49 PM  Result Value Ref Range   Color, Urine YELLOW YELLOW   APPearance CLEAR CLEAR   Specific Gravity, Urine 1.010 1.005 - 1.030   pH 7.0 5.0 - 8.0   Glucose, UA NEGATIVE NEGATIVE mg/dL   Hgb urine dipstick TRACE (A) NEGATIVE   Bilirubin Urine NEGATIVE NEGATIVE   Ketones, ur NEGATIVE NEGATIVE mg/dL    Protein, ur NEGATIVE NEGATIVE mg/dL   Nitrite NEGATIVE NEGATIVE   Leukocytes,Ua NEGATIVE NEGATIVE  Urinalysis, Microscopic (reflex)   Collection Time: 10/12/23  8:49 PM  Result Value Ref Range   RBC / HPF 0-5 0 - 5 RBC/hpf   WBC, UA NONE SEEN 0 - 5 WBC/hpf   Bacteria, UA NONE SEEN NONE SEEN   Squamous Epithelial / HPF NONE SEEN 0 - 5 /HPF   Mucus PRESENT   Basic metabolic panel   Collection Time: 10/12/23  8:50 PM  Result Value Ref Range   Sodium 136 135 - 145 mmol/L   Potassium 3.2 (L) 3.5 - 5.1 mmol/L   Chloride 103 98 - 111 mmol/L   CO2 23 22 - 32 mmol/L   Glucose, Bld 119 (H) 70 - 99 mg/dL   BUN 12 6 - 20 mg/dL   Creatinine, Ser 0.98 0.61 - 1.24 mg/dL   Calcium 9.1 8.9 - 11.9 mg/dL   GFR, Estimated >14 >78 mL/min   Anion gap 10 5 - 15  CBC   Collection Time: 10/12/23  8:50 PM  Result Value Ref Range   WBC 8.0 4.0 - 10.5 K/uL   RBC 4.95 4.22 - 5.81 MIL/uL   Hemoglobin 14.4 13.0 - 17.0 g/dL   HCT 29.5 62.1 - 30.8 %   MCV 83.2 80.0 - 100.0 fL   MCH 29.1 26.0 - 34.0 pg   MCHC 35.0 30.0 - 36.0 g/dL   RDW 65.7 84.6 - 96.2 %   Platelets 263 150 - 400 K/uL   nRBC 0.0 0.0 - 0.2 %  Troponin I (High Sensitivity)   Collection Time: 10/12/23  8:50 PM  Result Value Ref Range   Troponin I (High Sensitivity) 4 <18 ng/L  Resp panel by RT-PCR (RSV, Flu A&B, Covid) Anterior Nasal Swab   Collection Time: 10/12/23 11:26 PM   Specimen: Anterior Nasal Swab  Result Value Ref Range   SARS Coronavirus 2 by RT PCR NEGATIVE NEGATIVE   Influenza A by PCR NEGATIVE NEGATIVE   Influenza B by PCR NEGATIVE NEGATIVE   Resp Syncytial Virus by PCR NEGATIVE NEGATIVE   DG Chest 2 View Result Date: 10/12/2023 CLINICAL DATA:  Chest pain EXAM: CHEST - 2 VIEW COMPARISON:  03/01/2022 FINDINGS: The heart size and mediastinal contours are within normal limits. Both lungs are clear. The visualized skeletal structures are unremarkable. IMPRESSION: No active cardiopulmonary disease. Electronically Signed   By:  Jasmine Pang M.D.   On: 10/12/2023 21:44    EKG  Date: 10/13/2023  Rate: 89  Rhythm: normal sinus rhythm  QRS Axis: normal  Intervals: normal  ST/T Wave abnormalities: normal  Conduction Disutrbances: none  Narrative Interpretation: unremarkable     Radiology DG Chest 2 View Result Date: 10/12/2023 CLINICAL DATA:  Chest pain EXAM: CHEST - 2 VIEW COMPARISON:  03/01/2022 FINDINGS: The heart size and mediastinal contours are within normal limits. Both lungs are clear. The visualized skeletal structures are unremarkable. IMPRESSION: No active cardiopulmonary disease. Electronically Signed   By: Jasmine Pang M.D.   On: 10/12/2023 21:44    Procedures Procedures    Medications Ordered in ED Medications  acetaminophen (TYLENOL) tablet 1,000 mg (1,000 mg Oral Given 10/12/23 2337)  ibuprofen (ADVIL) tablet 400 mg (400 mg Oral Given 10/12/23 2337)    ED Course/ Medical Decision Making/ A&P                                 Medical Decision Making Patient with headache left chest pain and feeling jittery all day.  Came home and took BP and it was elevated in 160s and wife gave BP medication Lisinopril and immediately brought patient in   Amount and/or Complexity of Data Reviewed Independent Historian: spouse    Details: See above  Labs: ordered.    Details: Troponin normal 4, normal white count 8, normal hemoglobin 14.4, normal platelets.  Normal sodium slight low potassium 3.2, normal creatinine 1, negative covid and flu Radiology: ordered and independent interpretation performed.    Details: normal ECG/medicine tests: ordered and independent interpretation performed. Decision-making details documented in ED Course.  Risk OTC drugs. Prescription drug management. Risk Details: PERC negative wells 0 highly doubt PE in this low risk patient.  Given time course on negative EKG and troponin ruled out ACS.  Heart score is 1 very low risk for MACE.  BP was top normal here and it was  within 30 minutes of taking medication.  I suspect the cough was inaccurate.  I also, suspect some of this is anxiety related.  Patient was monitored in the ED and headache treated.  BP is completely normal.  Patient is instructed not to take other's medication and to follow up with PMD.  Stable for discharge.  Strict return     Final Clinical Impression(s) / ED Diagnoses Final diagnoses:  Elevated blood pressure reading   I have reviewed the triage vital signs and the nursing notes. Pertinent labs & imaging results that were available during my care of the patient were reviewed by me and considered in my medical decision making (see chart for details). After history, exam, and medical workup I feel the patient has been appropriately medically screened and is safe for discharge home. Pertinent diagnoses were discussed with the patient. Patient was given return precautions.  Rx / DC Orders ED Discharge Orders     None         Shannon Kirkendall, MD 10/13/23 9604

## 2024-05-16 ENCOUNTER — Emergency Department (HOSPITAL_BASED_OUTPATIENT_CLINIC_OR_DEPARTMENT_OTHER)
Admission: EM | Admit: 2024-05-16 | Discharge: 2024-05-16 | Disposition: A | Discharge status: Home / Self Care | Payer: Self-pay | Attending: Emergency Medicine | Admitting: Emergency Medicine

## 2024-05-16 ENCOUNTER — Encounter (HOSPITAL_BASED_OUTPATIENT_CLINIC_OR_DEPARTMENT_OTHER): Payer: Self-pay | Admitting: Emergency Medicine

## 2024-05-16 ENCOUNTER — Emergency Department (HOSPITAL_BASED_OUTPATIENT_CLINIC_OR_DEPARTMENT_OTHER): Payer: Self-pay

## 2024-05-16 ENCOUNTER — Other Ambulatory Visit: Payer: Self-pay

## 2024-05-16 DIAGNOSIS — I10 Essential (primary) hypertension: Secondary | ICD-10-CM | POA: Insufficient documentation

## 2024-05-16 DIAGNOSIS — R0789 Other chest pain: Secondary | ICD-10-CM | POA: Insufficient documentation

## 2024-05-16 DIAGNOSIS — Z79899 Other long term (current) drug therapy: Secondary | ICD-10-CM | POA: Insufficient documentation

## 2024-05-16 HISTORY — DX: Essential (primary) hypertension: I10

## 2024-05-16 LAB — CBC
HCT: 42.6 % (ref 39.0–52.0)
Hemoglobin: 14.7 g/dL (ref 13.0–17.0)
MCH: 29.2 pg (ref 26.0–34.0)
MCHC: 34.5 g/dL (ref 30.0–36.0)
MCV: 84.7 fL (ref 80.0–100.0)
Platelets: 278 K/uL (ref 150–400)
RBC: 5.03 MIL/uL (ref 4.22–5.81)
RDW: 12.7 % (ref 11.5–15.5)
WBC: 10.2 K/uL (ref 4.0–10.5)
nRBC: 0 % (ref 0.0–0.2)

## 2024-05-16 LAB — BASIC METABOLIC PANEL WITH GFR
Anion gap: 11 (ref 5–15)
BUN: 14 mg/dL (ref 6–20)
CO2: 25 mmol/L (ref 22–32)
Calcium: 9.4 mg/dL (ref 8.9–10.3)
Chloride: 102 mmol/L (ref 98–111)
Creatinine, Ser: 0.88 mg/dL (ref 0.61–1.24)
GFR, Estimated: >60 mL/min (ref >60)
Glucose, Bld: 145 mg/dL — ABNORMAL HIGH (ref 70–99)
Potassium: 4 mmol/L (ref 3.5–5.1)
Sodium: 138 mmol/L (ref 135–145)

## 2024-05-16 LAB — RESP PANEL BY RT-PCR (RSV, FLU A&B, COVID)  RVPGX2
Influenza A by PCR: NEGATIVE
Influenza B by PCR: NEGATIVE
Resp Syncytial Virus by PCR: NEGATIVE
SARS Coronavirus 2 by RT PCR: NEGATIVE

## 2024-05-16 LAB — D-DIMER, QUANTITATIVE: D-Dimer, Quant: <0.27 ug{FEU}/mL (ref 0.00–0.50)

## 2024-05-16 LAB — TROPONIN T, HIGH SENSITIVITY: Troponin T High Sensitivity: <15 ng/L (ref 0–19)

## 2024-05-16 MED ORDER — LIDOCAINE 5 % EX PTCH
1.0000 | MEDICATED_PATCH | CUTANEOUS | Status: DC
Start: 2024-05-16 — End: 2024-05-16
  Administered 2024-05-16: 1 via TRANSDERMAL
  Filled 2024-05-16: qty 1

## 2024-05-16 NOTE — ED Provider Notes (Signed)
 Care transferred to me.  Patient's lab work is unremarkable, including a normal troponin and D-dimer.  Chest x-ray viewed/interpreted by myself, no pneumothorax.  Agree with radiology.  ECG is overall unremarkable.  Patient is feeling better after the lidoderm  patch and feels well enough for discharge.  Otherwise, I discussed that given his acute worsening this morning we can do a second troponin though overall my suspicion is low this is ACS.  However he is declining this and wants to go home.  I think this is reasonable given low overall length of his symptoms.  Discussed this does not fully rule out heart disease but he will need to follow-up with his PCP.  Discussed return precautions.   EKG Interpretation Date/Time:  Tuesday May 16 2024 06:44:08 EDT Ventricular Rate:  96 PR Interval:  167 QRS Duration:  99 QT Interval:  329 QTC Calculation: 416 R Axis:   77  Text Interpretation: Sinus rhythm no acute ST /T changes No old tracing to compare Confirmed by Freddi Hamilton [phone]) on 05/16/2024 7:05:45 AM          Freddi Hamilton, MD 05/16/24 [phone]

## 2024-05-16 NOTE — ED Notes (Signed)
 Patient transported to X-ray

## 2024-05-16 NOTE — ED Provider Notes (Addendum)
 [place] EMERGENCY DEPARTMENT AT [facility] Provider Note   CSN: [id number] Arrival date & time: 05/16/24  9367     Patient presents with: Chest Pain   Zachary Monroe is a 48 y.o. male.    Chest Pain     48 year old male with medical history significant for hypertension on losartan who presents to the emergency department with roughly 10 days of chest pain.  The patient states that he has had pain in the left side of his chest, described as a pressure sensation, occasionally sharp.  It is somewhat reproducible to palpation and he has felt some swelling along the left chest wall.  He endorses some chills and was feeling lightheaded earlier this morning and noticed that the pain was radiating down his left arm prompting his presentation to the emergency department for further evaluation.  He denies any cough or fever.  Pain is not ripping or tearing or radiating to the back.  He took a full-strength aspirin this morning. Prior to Admission medications   Not on File    Allergies: Patient has no allergy information on record.    Review of Systems  Cardiovascular:  Positive for chest pain.  All other systems reviewed and are negative.   Updated Vital Signs BP (!) 146/95   Pulse 96   Temp 98.3 F (36.8 C)   Resp 18   Ht 5' 3 (1.6 m)   Wt 77.1 kg   SpO2 100%   BMI 30.11 kg/m   Physical Exam Vitals and nursing note reviewed.  Constitutional:      General: He is not in acute distress.    Appearance: He is well-developed.  HENT:     Head: Normocephalic and atraumatic.  Eyes:     Conjunctiva/sclera: Conjunctivae normal.  Cardiovascular:     Rate and Rhythm: Normal rate and regular rhythm.     Pulses: Normal pulses.  Pulmonary:     Effort: Pulmonary effort is normal. No respiratory distress.     Breath sounds: Normal breath sounds.  Chest:     Comments: Left sided chest wall TTP, no crepitus, slightly reproduces the patient's pain, no rash Abdominal:      Palpations: Abdomen is soft.     Tenderness: There is no abdominal tenderness.  Musculoskeletal:        General: No swelling.     Cervical back: Neck supple.  Skin:    General: Skin is warm and dry.     Capillary Refill: Capillary refill takes less than 2 seconds.  Neurological:     Mental Status: He is alert.  Psychiatric:        Mood and Affect: Mood normal.     (all labs ordered are listed, but only abnormal results are displayed) Labs Reviewed  RESP PANEL BY RT-PCR (RSV, FLU A&B, COVID)  [id number]  BASIC METABOLIC PANEL WITH GFR  CBC  D-DIMER, QUANTITATIVE  TROPONIN T, HIGH SENSITIVITY    EKG: None  Radiology: No results found.   Procedures   Medications Ordered in the ED  lidocaine  (LIDODERM ) 5 % 1 patch (has no administration in time range)                                    Medical Decision Making Amount and/or Complexity of Data Reviewed Labs: ordered. Radiology: ordered.  Risk Prescription drug management.   48 year old male with medical history significant for hypertension  on losartan who presents to the emergency department with roughly 10 days of chest pain.  The patient states that he has had pain in the left side of his chest, described as a pressure sensation, occasionally sharp.  It is somewhat reproducible to palpation and he has felt some swelling along the left chest wall.  He endorses some chills and was feeling lightheaded earlier this morning and noticed that the pain was radiating down his left arm prompting his presentation to the emergency department for further evaluation.  He denies any cough or fever.  Pain is not ripping or tearing or radiating to the back.  He took a full-strength aspirin this morning.  On arrival, the patient was afebrile, not tachycardic or tachypneic, heart rate 96, BP 146/95, saturating high percent on room air.  On exam, the patient had left-sided chest wall tenderness to palpation, no rash but slightly reproduced  his pain.  He has had some radiation of pain down the arm.  Differential diagnosis includes ACS, pericarditis, pneumothorax, pneumonia, viral infection, musculoskeletal chest pain, PE, less likely aortic dissection.  Initial EKG revealed sinus rhythm, ventricular rate 96, no evidence of STEMI, no abnormal intervals.  Chest x-ray and screening laboratory evaluation was pending at time of signout.  Lidocaine  patch ordered for placement along the patient's left chest wall.  Plan at time of signout to follow-up results of laboratory evaluation and x-ray imaging, reassess the patient, ultimate disposition depending on the results of further diagnostic testing and reassessment.  Signout given to Dr. Freddi at 0700.     Final diagnoses:  None    ED Discharge Orders     None           Jerrol Agent, MD 05/16/24 [phone]

## 2024-05-16 NOTE — Discharge Instructions (Signed)
 Your testing today does not indicate a heart attack.  However this does not fully rule out heart disease and so you should follow-up closely with your primary care provider.  Otherwise you may take ibuprofen  to help with pain.  If you develop recurrent, continued, or worsening chest pain, shortness of breath, fever, vomiting, abdominal or back pain, or any other new/concerning symptoms then return to the ER for evaluation.

## 2024-05-16 NOTE — ED Triage Notes (Addendum)
 Pt c/o constant left sided chest pain that radiates to his left arm x 1 week. States that on the way to work this morning he started having chills and feeling dizzy.   324 asa this AM
